# Patient Record
Sex: Female | Born: 1980 | Race: White | Hispanic: No | Marital: Married | Smoking: Never smoker
Health system: Southern US, Community
[De-identification: ages and names within clinical notes are randomized; demographics above are authoritative.]

---

## 2003-08-01 ENCOUNTER — Other Ambulatory Visit: Admission: RE | Admit: 2003-08-01 | Discharge: 2003-08-01 | Payer: Self-pay | Admitting: Obstetrics and Gynecology

## 2018-01-20 ENCOUNTER — Other Ambulatory Visit: Payer: Self-pay

## 2018-01-20 ENCOUNTER — Ambulatory Visit (HOSPITAL_COMMUNITY)
Admission: EM | Admit: 2018-01-20 | Discharge: 2018-01-20 | Disposition: A | Discharge status: Home / Self Care | Payer: BLUE CROSS/BLUE SHIELD | Attending: Family Medicine | Admitting: Family Medicine

## 2018-01-20 ENCOUNTER — Encounter (HOSPITAL_COMMUNITY): Payer: Self-pay | Admitting: *Deleted

## 2018-01-20 DIAGNOSIS — J039 Acute tonsillitis, unspecified: Secondary | ICD-10-CM | POA: Diagnosis not present

## 2018-01-20 DIAGNOSIS — R509 Fever, unspecified: Secondary | ICD-10-CM

## 2018-01-20 MED ORDER — AMOXICILLIN-POT CLAVULANATE 875-125 MG PO TABS: 1.0000 | ORAL_TABLET | Freq: Two times a day (BID) | ORAL | 0 refills | Status: AC

## 2018-01-20 MED ORDER — DEXAMETHASONE SODIUM PHOSPHATE 10 MG/ML IJ SOLN
INTRAMUSCULAR | Status: AC
  Filled 2018-01-20: qty 1

## 2018-01-20 MED ORDER — ONDANSETRON 4 MG PO TBDP
ORAL_TABLET | ORAL | Status: AC
  Filled 2018-01-20: qty 1

## 2018-01-20 MED ORDER — ONDANSETRON 4 MG PO TBDP
4.0000 mg | ORAL_TABLET | Freq: Once | ORAL | Status: AC
  Administered 2018-01-20: 4 mg via ORAL

## 2018-01-20 MED ORDER — ACETAMINOPHEN 325 MG PO TABS
650.0000 mg | ORAL_TABLET | Freq: Once | ORAL | Status: AC
  Administered 2018-01-20: 650 mg via ORAL

## 2018-01-20 MED ORDER — ACETAMINOPHEN 325 MG PO TABS
325.0000 mg | ORAL_TABLET | Freq: Once | ORAL | Status: AC
  Administered 2018-01-20: 325 mg via ORAL

## 2018-01-20 MED ORDER — ACETAMINOPHEN 325 MG PO TABS: 325.0000 mg | ORAL_TABLET | Freq: Once | ORAL | Status: DC

## 2018-01-20 MED ORDER — ACETAMINOPHEN 325 MG PO TABS
ORAL_TABLET | ORAL | Status: AC
  Filled 2018-01-20: qty 1

## 2018-01-20 MED ORDER — ONDANSETRON HCL 4 MG PO TABS: 4.0000 mg | ORAL_TABLET | Freq: Three times a day (TID) | ORAL | 0 refills | Status: AC | PRN

## 2018-01-20 MED ORDER — ACETAMINOPHEN 325 MG PO TABS
ORAL_TABLET | ORAL | Status: AC
  Filled 2018-01-20: qty 2

## 2018-01-20 MED ORDER — DEXAMETHASONE SODIUM PHOSPHATE 10 MG/ML IJ SOLN
10.0000 mg | Freq: Once | INTRAMUSCULAR | Status: AC
  Administered 2018-01-20: 10 mg

## 2018-01-20 NOTE — ED Provider Notes (Signed)
[facility]    CSN: [id number] Arrival date & time: 01/20/18  1811     History   Chief Complaint Chief Complaint  Patient presents with  . Fever    HPI Susan Bird is a 37 y.o. female.   [name] presents with complaints of worsening throat pain, swelling and fevers. Originally started 4 days ago, has had temps of 102. Today after napping temp of 104.5. She was seen yesterday by her PCP and was negative for strep, mono and flu. Was started on pen v. She has taken 4 doses. She took 1000mg  tylenol last at 1230 today. She is allergic to ibuprofen. She has taken a few doses of aspirin which have not helped. She has vomited twice, last was last night. Headache and right ear pain. Neck is sore. Denies previous similar. without cough or runny nose. Feels she has some post nasal drip. Nausea has improved now that she has received zofran in clinic. Without contributing medical history. Without rash. No known ill contacts.    ROS per HPI.       History reviewed. No pertinent past medical history.  There are no active problems to display for this patient.   Past Surgical History:  Procedure Laterality Date  . CESAREAN SECTION     x2    OB History    No data available       Home Medications    Prior to Admission medications   Medication Sig Start Date End Date Taking? Authorizing Provider  ACETAMINOPHEN PO Take by mouth.   Yes [provider]  penicillin v potassium (VEETID) 500 MG tablet Take 500 mg by mouth 3 (three) times daily.   Yes [provider]  amoxicillin-clavulanate (AUGMENTIN) 875-125 MG tablet Take 1 tablet by mouth every 12 (twelve) hours for 10 days. 01/20/18 01/30/18  Georgetta Haber, NP  ondansetron (ZOFRAN) 4 MG tablet Take 1 tablet (4 mg total) by mouth every 8 (eight) hours as needed for nausea or vomiting. 01/20/18   Georgetta Haber, NP    Family History No family history on file.  Social History Social History   Tobacco Use   . Smoking status: Never Smoker  Substance Use Topics  . Alcohol use: Yes    Comment: occasionally  . Drug use: No     Allergies   Cephalosporins and Ibuprofen   Review of Systems Review of Systems   Physical Exam Triage Vital Signs ED Triage Vitals  Enc Vitals Group     BP 01/20/18 1918 116/68     Pulse Rate 01/20/18 1918 (!) 124     Resp 01/20/18 1918 20     Temp 01/20/18 1918 (!) 101.1 F (38.4 C)     Temp Source 01/20/18 1918 Oral     SpO2 01/20/18 1918 100 %     Weight --      Height --      Head Circumference --      Peak Flow --      Pain Score 01/20/18 1919 9     Pain Loc --      Pain Edu? --      Excl. in GC? --    No data found.  Updated Vital Signs BP 116/68   Pulse (!) 124   Temp (!) 101.1 F (38.4 C) (Oral)   Resp 20   LMP 01/12/2018 (Exact Date)   SpO2 100%   Visual Acuity Right Eye Distance:   Left Eye Distance:  Bilateral Distance:    Right Eye Near:   Left Eye Near:    Bilateral Near:     Physical Exam  Constitutional: She is oriented to person, place, and time. She appears well-developed and well-nourished. She appears ill. No distress.  HENT:  Head: Normocephalic and atraumatic.  Right Ear: External ear and ear canal normal. Tympanic membrane is erythematous.  Left Ear: Tympanic membrane, external ear and ear canal normal.  Nose: Nose normal.  Mouth/Throat: Uvula is midline, oropharynx is clear and moist and mucous membranes are normal. Tonsils are 2+ on the right. Tonsils are 2+ on the left. Tonsillar exudate.  Uvula midline; right tonsil > left with significant exudate; swallowing without difficulty   Eyes: Conjunctivae and EOM are normal. Pupils are equal, round, and reactive to light.  Cardiovascular: Regular rhythm and normal heart sounds. Tachycardia present.  Pulmonary/Chest: Effort normal and breath sounds normal.  Lymphadenopathy:    She has cervical adenopathy.  Neurological: She is alert and oriented to person,  place, and time.  Skin: Skin is warm and dry.     UC Treatments / Results  Labs (all labs ordered are listed, but only abnormal results are displayed) Labs Reviewed - No data to display  EKG  EKG Interpretation None       Radiology No results found.  Procedures Procedures (including critical care time)  Medications Ordered in UC Medications  dexamethasone (DECADRON) injection 10 mg (not administered)  acetaminophen (TYLENOL) tablet 650 mg (650 mg Oral Given 01/20/18 1928)  ondansetron (ZOFRAN-ODT) disintegrating tablet 4 mg (4 mg Oral Given 01/20/18 1927)     Initial Impression / Assessment and Plan / UC Course  I have reviewed the triage vital signs and the nursing notes.  Pertinent labs & imaging results that were available during my care of the patient were reviewed by me and considered in my medical decision making (see chart for details).     Per chart review notes consistent with patient history with negative strep, flu and mono. Will try changing to augmentin. Decadron provided. Tylenol given in clinic. Return precautions provided. Patient verbalized understanding and agreeable to plan.    Final Clinical Impressions(s) / UC Diagnoses   Final diagnoses:  Tonsillitis  Fever, unspecified    ED Discharge Orders        Ordered    amoxicillin-clavulanate (AUGMENTIN) 875-125 MG tablet  Every 12 hours     01/20/18 2003    ondansetron (ZOFRAN) 4 MG tablet  Every 8 hours PRN     01/20/18 2003       Controlled Substance Prescriptions [place] Controlled Substance Registry consulted? Not Applicable   Georgetta HaberBurky, Sten Dematteo B, NP 01/20/18 2013

## 2018-01-20 NOTE — ED Triage Notes (Signed)
Was put on PCN for clinical appearance of strep yesterday, despite neg strep.  Today feeling much worse; temp up to 104.5 this afternoon.  Has been taking tyl - last dose @ 1230.  C/O nausea, one episode vomiting, neck pain, sore throat.  Feels throat is becoming more swollen.

## 2018-01-20 NOTE — Discharge Instructions (Signed)
Push fluids to ensure adequate hydration and keep secretions thin.  Tylenol as needed for pain or fevers.  Physically cooling your body can help with fever reduction as well. Will change your antibiotic. May start tonight if you haven't already taken your night dose of penicillin. If develop worsening of pain, difficulty swallowing, visible swelling to neck or otherwise worsening please go to the Er.

## 2020-01-18 ENCOUNTER — Ambulatory Visit: Payer: Medicaid Other | Attending: Internal Medicine

## 2020-01-18 DIAGNOSIS — Z23 Encounter for immunization: Secondary | ICD-10-CM

## 2020-01-18 NOTE — Progress Notes (Signed)
   Covid-19 Vaccination Clinic  Name:  ANNELI BING    MRN: [id number] DOB: Apr 07, 1981  01/18/2020  Ms. Lisa was observed post Covid-19 immunization for 15 minutes without incident. She was provided with Vaccine Information Sheet and instruction to access the V-Safe system.   Ms. Sussman was instructed to call 911 with any severe reactions post vaccine: Marland Kitchen Difficulty breathing  . Swelling of face and throat  . A fast heartbeat  . A bad rash all over body  . Dizziness and weakness   Immunizations Administered    Name Date Dose VIS Date Route   Pfizer COVID-19 Vaccine 01/18/2020  8:57 AM 0.3 mL 10/25/2019 Intramuscular   Manufacturer: [employer], [employer]   Lot: [id number]   NDC: [address]-4

## 2020-02-08 ENCOUNTER — Ambulatory Visit: Payer: Medicaid Other | Attending: Internal Medicine

## 2020-02-08 DIAGNOSIS — Z23 Encounter for immunization: Secondary | ICD-10-CM

## 2020-02-08 NOTE — Progress Notes (Signed)
   Covid-19 Vaccination Clinic  Name:  GLENDELL SCHLOTTMAN    MRN: [id number] DOB: [birth year]  02/08/2020  Ms. Balla was observed post Covid-19 immunization for 15 minutes without incident. She was provided with Vaccine Information Sheet and instruction to access the V-Safe system.   Ms. Allport was instructed to call 911 with any severe reactions post vaccine: Marland Kitchen Difficulty breathing  . Swelling of face and throat  . A fast heartbeat  . A bad rash all over body  . Dizziness and weakness   Immunizations Administered    Name Date Dose VIS Date Route   Pfizer COVID-19 Vaccine 02/08/2020  8:34 AM 0.3 mL 10/25/2019 Intramuscular   Manufacturer: [employer], [employer]   Lot: [id number]   NDC: [address]-2

## 2020-10-07 ENCOUNTER — Emergency Department (HOSPITAL_COMMUNITY)
Admission: EM | Admit: 2020-10-07 | Discharge: 2020-10-07 | Disposition: A | Discharge status: Home / Self Care | Payer: PRIVATE HEALTH INSURANCE | Attending: Emergency Medicine | Admitting: Emergency Medicine

## 2020-10-07 ENCOUNTER — Emergency Department (HOSPITAL_COMMUNITY): Payer: PRIVATE HEALTH INSURANCE

## 2020-10-07 ENCOUNTER — Other Ambulatory Visit: Payer: Self-pay

## 2020-10-07 ENCOUNTER — Encounter (HOSPITAL_COMMUNITY): Payer: Self-pay | Admitting: Emergency Medicine

## 2020-10-07 DIAGNOSIS — X58XXXA Exposure to other specified factors, initial encounter: Secondary | ICD-10-CM | POA: Insufficient documentation

## 2020-10-07 DIAGNOSIS — M62830 Muscle spasm of back: Secondary | ICD-10-CM

## 2020-10-07 DIAGNOSIS — S39012A Strain of muscle, fascia and tendon of lower back, initial encounter: Secondary | ICD-10-CM | POA: Diagnosis not present

## 2020-10-07 DIAGNOSIS — M545 Low back pain, unspecified: Secondary | ICD-10-CM

## 2020-10-07 DIAGNOSIS — S3992XA Unspecified injury of lower back, initial encounter: Secondary | ICD-10-CM | POA: Diagnosis present

## 2020-10-07 LAB — CBC WITH DIFFERENTIAL/PLATELET
Abs Immature Granulocytes: 0.02 K/uL (ref 0.00–0.07)
Basophils Absolute: 0.1 K/uL (ref 0.0–0.1)
Basophils Relative: 1 %
Eosinophils Absolute: 0.2 K/uL (ref 0.0–0.5)
Eosinophils Relative: 3 %
HCT: 42.6 % (ref 36.0–46.0)
Hemoglobin: 14.4 g/dL (ref 12.0–15.0)
Immature Granulocytes: 0 %
Lymphocytes Relative: 16 %
Lymphs Abs: 1.4 K/uL (ref 0.7–4.0)
MCH: 29.7 pg (ref 26.0–34.0)
MCHC: 33.8 g/dL (ref 30.0–36.0)
MCV: 87.8 fL (ref 80.0–100.0)
Monocytes Absolute: 0.4 K/uL (ref 0.1–1.0)
Monocytes Relative: 4 %
Neutro Abs: 6.6 K/uL (ref 1.7–7.7)
Neutrophils Relative %: 76 %
Platelets: 282 K/uL (ref 150–400)
RBC: 4.85 MIL/uL (ref 3.87–5.11)
RDW: 12.9 % (ref 11.5–15.5)
WBC: 8.6 K/uL (ref 4.0–10.5)
nRBC: 0 % (ref 0.0–0.2)

## 2020-10-07 LAB — URINALYSIS, ROUTINE W REFLEX MICROSCOPIC
Bilirubin Urine: NEGATIVE
Glucose, UA: NEGATIVE mg/dL
Hgb urine dipstick: NEGATIVE
Ketones, ur: NEGATIVE mg/dL
Leukocytes,Ua: NEGATIVE
Nitrite: NEGATIVE
Protein, ur: NEGATIVE mg/dL
Specific Gravity, Urine: 1.025 (ref 1.005–1.030)
pH: 7 (ref 5.0–8.0)

## 2020-10-07 LAB — COMPREHENSIVE METABOLIC PANEL
ALT: 13 U/L (ref 0–44)
AST: 16 U/L (ref 15–41)
Albumin: 3.6 g/dL (ref 3.5–5.0)
Alkaline Phosphatase: 40 U/L (ref 38–126)
Anion gap: 7 (ref 5–15)
BUN: 9 mg/dL (ref 6–20)
CO2: 22 mmol/L (ref 22–32)
Calcium: 8.4 mg/dL — ABNORMAL LOW (ref 8.9–10.3)
Chloride: 109 mmol/L (ref 98–111)
Creatinine, Ser: 0.63 mg/dL (ref 0.44–1.00)
GFR, Estimated: >60 mL/min (ref >60)
Glucose, Bld: 114 mg/dL — ABNORMAL HIGH (ref 70–99)
Potassium: 4 mmol/L (ref 3.5–5.1)
Sodium: 138 mmol/L (ref 135–145)
Total Bilirubin: 0.5 mg/dL (ref 0.3–1.2)
Total Protein: 6.9 g/dL (ref 6.5–8.1)

## 2020-10-07 LAB — I-STAT BETA HCG BLOOD, ED (MC, WL, AP ONLY): I-stat hCG, quantitative: <5 m[IU]/mL (ref <5)

## 2020-10-07 LAB — MAGNESIUM: Magnesium: 2 mg/dL (ref 1.7–2.4)

## 2020-10-07 MED ORDER — CYCLOBENZAPRINE HCL 10 MG PO TABS: 10.0000 mg | ORAL_TABLET | Freq: Two times a day (BID) | ORAL | 0 refills | Status: AC | PRN

## 2020-10-07 MED ORDER — CYCLOBENZAPRINE HCL 10 MG PO TABS
10.0000 mg | ORAL_TABLET | Freq: Once | ORAL | Status: AC
  Administered 2020-10-07: 10 mg via ORAL
  Filled 2020-10-07: qty 1

## 2020-10-07 MED ORDER — LIDOCAINE 5 % EX PTCH
1.0000 | MEDICATED_PATCH | CUTANEOUS | Status: DC
  Administered 2020-10-07: 1 via TRANSDERMAL
  Filled 2020-10-07: qty 1

## 2020-10-07 MED ORDER — OXYCODONE-ACETAMINOPHEN 5-325 MG PO TABS
1.0000 | ORAL_TABLET | Freq: Once | ORAL | Status: AC
Start: 2020-10-07 — End: 2020-10-07
  Administered 2020-10-07: 1 via ORAL
  Filled 2020-10-07: qty 1

## 2020-10-07 MED ORDER — MORPHINE SULFATE (PF) 4 MG/ML IV SOLN
4.0000 mg | Freq: Once | INTRAVENOUS | Status: AC
  Administered 2020-10-07: 4 mg via INTRAVENOUS
  Filled 2020-10-07: qty 1

## 2020-10-07 MED ORDER — LIDOCAINE 5 % EX PTCH: 1.0000 | MEDICATED_PATCH | CUTANEOUS | 0 refills | Status: AC

## 2020-10-07 MED ORDER — OXYCODONE-ACETAMINOPHEN 5-325 MG PO TABS: 1.0000 | ORAL_TABLET | ORAL | 0 refills | Status: AC | PRN

## 2020-10-07 NOTE — Discharge Instructions (Signed)
Your images today did not show evidence of acute fracture dislocation.  Your history and exam are consistent with muscle spasms causing your discomfort.  Please use the patches, pain medicine, muscle relaxant help with the discomfort and please try to rest.  Please follow-up with your primary doctor and if symptoms do not improve, consider following up with outpatient neurosurgery.  If any symptoms change or worsen, please return to the nearest emergency department.

## 2020-10-07 NOTE — ED Notes (Signed)
Pt emptied bladder and purewick was removed per pt request.

## 2020-10-07 NOTE — ED Notes (Signed)
Purewick placed on pt. 

## 2020-10-07 NOTE — ED Provider Notes (Signed)
[facility]EMERGENCY [facility]PT Provider Note   CSN: [id number] Arrival date & time: 10/07/20  [id number]     History Chief Complaint  Patient presents with  . Back Pain    Susan Bird is a 39 y.o. female.  The history is provided by the patient and medical records. No language interpreter was used.  Back Pain Location:  Lumbar spine Quality:  Aching, cramping and stabbing Radiates to:  Does not radiate Pain severity:  Severe Pain is:  Unable to specify Onset quality:  Gradual Duration:  1 day Timing:  Constant Progression:  Unchanged Chronicity:  New Context: not falling, not jumping from heights, not lifting heavy objects, not MVA, not occupational injury, not physical stress, not recent illness, not recent injury and not twisting   Relieved by:  Nothing Worsened by:  Sitting, twisting, touching, standing, movement and bending Ineffective treatments:  OTC medications Associated symptoms: no abdominal pain, no abdominal swelling, no bladder incontinence, no bowel incontinence, no chest pain, no dysuria, no fever, no headaches, no leg pain, no numbness, no paresthesias, no pelvic pain, no perianal numbness, no tingling and no weakness        History reviewed. No pertinent past medical history.  There are no problems to display for this patient.   Past Surgical History:  Procedure Laterality Date  . CESAREAN SECTION     x2     OB History   No obstetric history on file.     History reviewed. No pertinent family history.  Social History   Tobacco Use  . Smoking status: Never Smoker  Substance Use Topics  . Alcohol use: Yes    Comment: occasionally  . Drug use: No    Home Medications Prior to Admission medications   Medication Sig Start Date End Date Taking? Authorizing Provider  ACETAMINOPHEN PO Take by mouth.    [provider]  ondansetron (ZOFRAN) 4 MG tablet Take 1 tablet (4 mg total) by mouth every 8 (eight) hours as needed  for nausea or vomiting. 01/20/18   Linus MakoBurky, Natalie B, NP  penicillin v potassium (VEETID) 500 MG tablet Take 500 mg by mouth 3 (three) times daily.    [provider]    Allergies    Cephalosporins and Ibuprofen  Review of Systems   Review of Systems  Constitutional: Negative for chills, diaphoresis, fatigue and fever.  HENT: Negative for congestion.   Eyes: Negative for visual disturbance.  Respiratory: Negative for cough, chest tightness and shortness of breath.   Cardiovascular: Negative for chest pain and palpitations.  Gastrointestinal: Negative for abdominal pain, bowel incontinence, constipation, diarrhea, nausea and vomiting.  Genitourinary: Negative for bladder incontinence, dysuria, flank pain, frequency and pelvic pain.  Musculoskeletal: Positive for back pain. Negative for neck pain and neck stiffness.  Neurological: Negative for tingling, weakness, light-headedness, numbness, headaches and paresthesias.  Psychiatric/Behavioral: Negative for agitation and confusion.  All other systems reviewed and are negative.   Physical Exam Updated Vital Signs BP 120/75 (BP Location: Right Arm)   Pulse 75   Temp 98.5 F (36.9 C) (Oral)   Resp 13   Ht 5\' 3"  (1.6 m)   Wt 68 kg   SpO2 99%   BMI 26.57 kg/m   Physical Exam Vitals and nursing note reviewed.  Constitutional:      General: She is not in acute distress.    Appearance: She is well-developed. She is not ill-appearing, toxic-appearing or diaphoretic.  HENT:     Head: Normocephalic  and atraumatic.     Right Ear: External ear normal.     Left Ear: External ear normal.     Nose: Nose normal.     Mouth/Throat:     Mouth: Mucous membranes are moist.     Pharynx: No oropharyngeal exudate or posterior oropharyngeal erythema.  Eyes:     Extraocular Movements: Extraocular movements intact.     Conjunctiva/sclera: Conjunctivae normal.     Pupils: Pupils are equal, round, and reactive to light.  Cardiovascular:      Rate and Rhythm: Normal rate.     Pulses: Normal pulses.     Heart sounds: No murmur heard.   Pulmonary:     Effort: No respiratory distress.     Breath sounds: No stridor. No wheezing, rhonchi or rales.  Chest:     Chest wall: No tenderness.  Abdominal:     General: Abdomen is flat. There is no distension.     Tenderness: There is no abdominal tenderness. There is no right CVA tenderness, left CVA tenderness, guarding or rebound.  Musculoskeletal:        General: Tenderness present. No signs of injury.     Cervical back: Normal range of motion and neck supple. No signs of trauma or tenderness.     Thoracic back: No signs of trauma or tenderness.     Lumbar back: Spasms and tenderness present. No deformity or bony tenderness. Negative right straight leg raise test and negative left straight leg raise test.       Back:     Right lower leg: No edema.     Left lower leg: No edema.     Comments: Intact, strength, sensation, and pulses in the legs.  No anesthesia in her lower back or buttock area.  Skin:    General: Skin is warm.     Capillary Refill: Capillary refill takes less than 2 seconds.     Coloration: Skin is not pale.     Findings: No erythema or rash.  Neurological:     General: No focal deficit present.     Mental Status: She is alert and oriented to person, place, and time.     Sensory: No sensory deficit.     Motor: No weakness or abnormal muscle tone.     Deep Tendon Reflexes: Reflexes are normal and symmetric.  Psychiatric:        Mood and Affect: Mood normal.     ED Results / Procedures / Treatments   Labs (all labs ordered are listed, but only abnormal results are displayed) Labs Reviewed  COMPREHENSIVE METABOLIC PANEL - Abnormal; Notable for the following components:      Result Value   Glucose, Bld 114 (*)    Calcium 8.4 (*)    All other components within normal limits  URINALYSIS, ROUTINE W REFLEX MICROSCOPIC  CBC WITH DIFFERENTIAL/PLATELET  MAGNESIUM    I-STAT BETA HCG BLOOD, ED ([facility], [facility], [facility] ONLY)    EKG None  Radiology DG Lumbar Spine Complete  Result Date: 10/07/2020 CLINICAL DATA:  Low back pain. Additional history provided: Patient reports mid low back pain which began last night, worsening throughout the night, no known injury. EXAM: LUMBAR SPINE - COMPLETE 4+ VIEW COMPARISON:  No pertinent prior exams available for comparison. FINDINGS: Five lumbar vertebrae. No significant spondylolisthesis. Vertebral body height is maintained. Mild L5-S1 disc space narrowing. No more than mild disc space narrowing at the remaining levels. Two tubal ligation clips are somewhat high in position,  projecting in the region of the lower abdomen. IMPRESSION: No lumbar compression deformity. Mild lumbar spondylosis greatest at L5-S1. Two tubal ligation clips are somewhat high in position, projecting in the region of the lower abdomen. This is of uncertain clinical significance. Correlate with the procedural history. Electronically Signed   By: Jackey Loge DO   On: 10/07/2020 10:59    Procedures Procedures (including critical care time)  Medications Ordered in ED Medications  lidocaine (LIDODERM) 5 % 1 patch (1 patch Transdermal Patch Applied 10/07/20 1014)  morphine 4 MG/ML injection 4 mg (has no administration in time range)  cyclobenzaprine (FLEXERIL) tablet 10 mg (10 mg Oral Given 10/07/20 1013)  oxyCODONE-acetaminophen (PERCOCET/ROXICET) 5-325 MG per tablet 1 tablet (1 tablet Oral Given 10/07/20 1013)  morphine 4 MG/ML injection 4 mg (4 mg Intravenous Given 10/07/20 1316)    ED Course  I have reviewed the triage vital signs and the nursing notes.  Pertinent labs & imaging results that were available during my care of the patient were reviewed by me and considered in my medical decision making (see chart for details).    MDM Rules/Calculators/A&P                          Keenan C Klayton Monie is a 39 y.o. female with no significant past medical history  who presents with severe back pain.  She reports that the back pain yesterday and she thought it was a backache across her low back.  She reports take some Tylenol.  She reports that throughout the night it worsened and was up to 9 or 10 in severity this morning.  She reports that she had no preceding injury and no preceding trauma.  She denies lifting anything heavy.  She did report that while bending down trying to pull her pajamas up, it acutely worsened.  She reports it does not run down her legs and she denies any loss of bowel or bladder control, numbness, tingling, or weakness of legs.  She reports it is not go up into her upper back.  She reports does not go to the abdomen.  She reports it is an aching and sharp.  She says that when she is in a neutral position and not moving it improves.  She has never had back pain like this before.  She does report an extremely strong family history of kidney stones with both her brother and father having numerous stones.  She denies nausea, vomiting, fevers, chills, IV drug use.  She denies any chest pain or shortness of breath.  She reports no dysuria, hematuria, or urine changes.  She was in such pain, EMS was called as she could not walk to the pain.  She was given fentanyl in route which took the edge off.  She reports this still severe she tries to move.  On exam, patient had tenderness in her bilateral paraspinal lumbar area.  There was some palpated muscle tightness and spasm.  Her midline back was nontender.  She had no rashes.  Lungs were clear.  Abdomen was nontender and pelvis was nontender.  Straight leg raise negative on both sides with no sciatic discomfort.  Normal pulses, strength, sensation in legs.  No CVA tenderness.  Clinical I suspect this is severe lumbar pain with muscle spasms and muscle strain.  We discussed that with her extremely strong family history of kidney stones, stone is still considered and patient is amenable to trying laboratory  work-up with some labs to look for hematuria in the urine or infection which could cause bilateral pain with [place].  We will also get some screening labs to look for electrolyte imbalance leading to muscle spasms.  We agreed that we discussed possible imaging if needed based on the lab results.  If patient is hematuria, will consider a stone study to get a better look at her back as well as make sure she does not have any stone.  Patient did not want pain medicine initially as she is resting but when she tries to get up and move, she will likely need some medications.  Anticipate reassessment for work-up.  Laboratory testing showed no evidence of hematuria, doubt UTI or kidney stone as cause of pain.  She is not pregnant.  Other labs reassuring.  X-ray showed no acute fracture or dislocation.  Based on exam, doubt cauda equina or other acute cord compression or nerve injury.  She has no radicular symptoms, weakness, numbness, or bladder/bowel control problems.  After getting some morphine, her pain improved and she was able to sit up around an hour.  She was given one more dose of pain medicine prior to discharge home and was given Lidoderm, Flexeril, and oral pain medicine.  She will get a prescription for these and will follow up with outpatient PCP and was given contact information for neurosurgery if it persist.  I suspect muscle spasms and low back strain.  Patient will be discharged home and understands return precautions for any new or worsened symptoms.   Final Clinical Impression(s) / ED Diagnoses Final diagnoses:  Acute bilateral low back pain without sciatica  Muscle spasm of back  Strain of lumbar region, initial encounter    Rx / DC Orders ED Discharge Orders         Ordered    oxyCODONE-acetaminophen (PERCOCET/ROXICET) 5-325 MG tablet  Every 4 hours PRN        10/07/20 1619    cyclobenzaprine (FLEXERIL) 10 MG tablet  2 times daily PRN        10/07/20 1619    lidocaine  (LIDODERM) 5 %  Every 24 hours        10/07/20 1619         Clinical Impression: 1. Acute bilateral low back pain without sciatica   2. Muscle spasm of back   3. Strain of lumbar region, initial encounter     Disposition: Discharge  Condition: Good  I have discussed the results, Dx and Tx plan with the pt(& family if present). He/she/they expressed understanding and agree(s) with the plan. Discharge instructions discussed at great length. Strict return precautions discussed and pt &/or family have verbalized understanding of the instructions. No further questions at time of discharge.    New Prescriptions   CYCLOBENZAPRINE (FLEXERIL) 10 MG TABLET    Take 1 tablet (10 mg total) by mouth 2 (two) times daily as needed for muscle spasms.   LIDOCAINE (LIDODERM) 5 %    Place 1 patch onto the skin daily. Remove & Discard patch within 12 hours or as directed by MD   OXYCODONE-ACETAMINOPHEN (PERCOCET/ROXICET) 5-325 MG TABLET    Take 1 tablet by mouth every 4 (four) hours as needed for severe pain.    Follow Up: Pa, [facility] [address] [place] [address] [phone]     [facility] [address] [id number] [place] [address] [phone]  Jaiceon Collister, Canary Brim, MD 10/07/20 [phone]

## 2020-10-07 NOTE — ED Triage Notes (Signed)
BIBA Per EMS:   Pt coming from home with complaints of back pain that began last night and has been worsening throughout the night  Denies injury  fentanyl  Vitals:  125/81 81HR 100% RA  169 CBG  IV: 18G L AC

## 2020-11-03 ENCOUNTER — Other Ambulatory Visit: Payer: PRIVATE HEALTH INSURANCE

## 2021-12-21 IMAGING — CR DG LUMBAR SPINE COMPLETE 4+V
5 series · 5 of 5 positions shown · non-contrast
Comparison: No pertinent prior exams available for comparison.

CLINICAL DATA: Low back pain. Additional history provided: Patient
reports mid low back pain which began last night, worsening
throughout the night, no known injury.

EXAM:
LUMBAR SPINE - COMPLETE 4+ VIEW

[t lumbar spine ap]
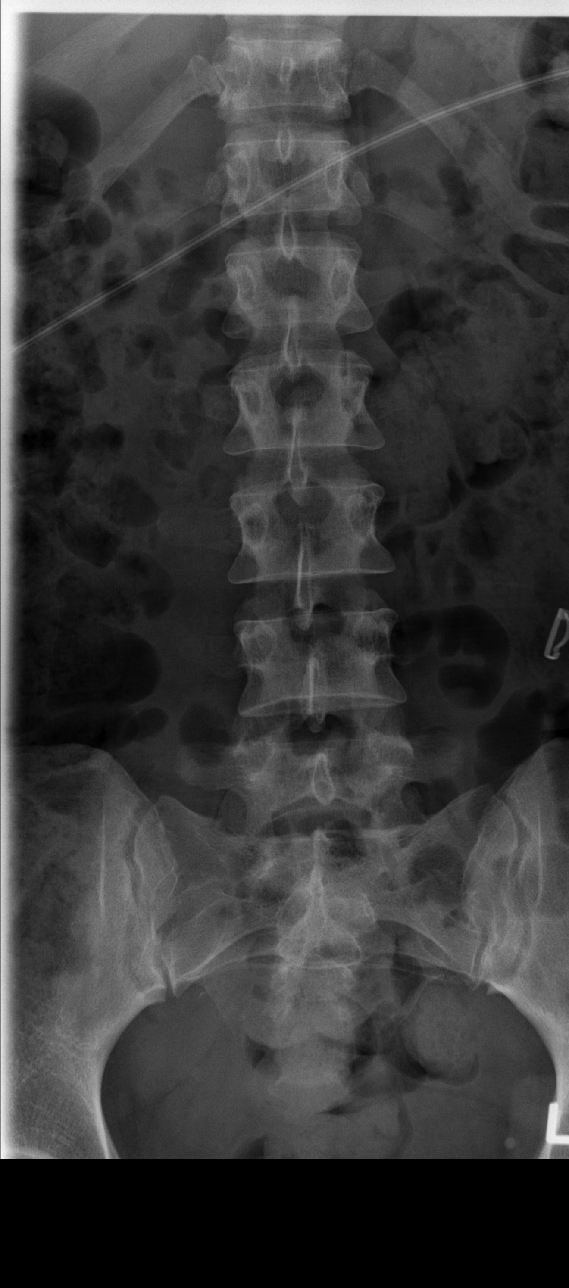

[t lumbar spine obl (1 of 2)]
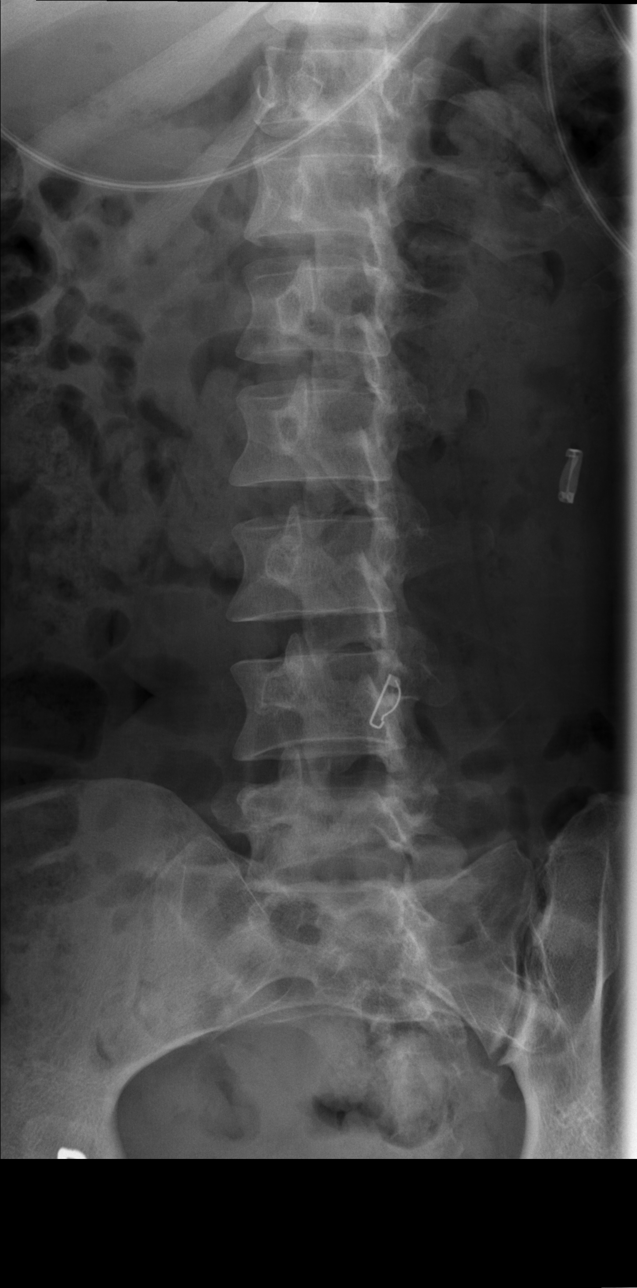

[t lumbar spine obl (2 of 2)]
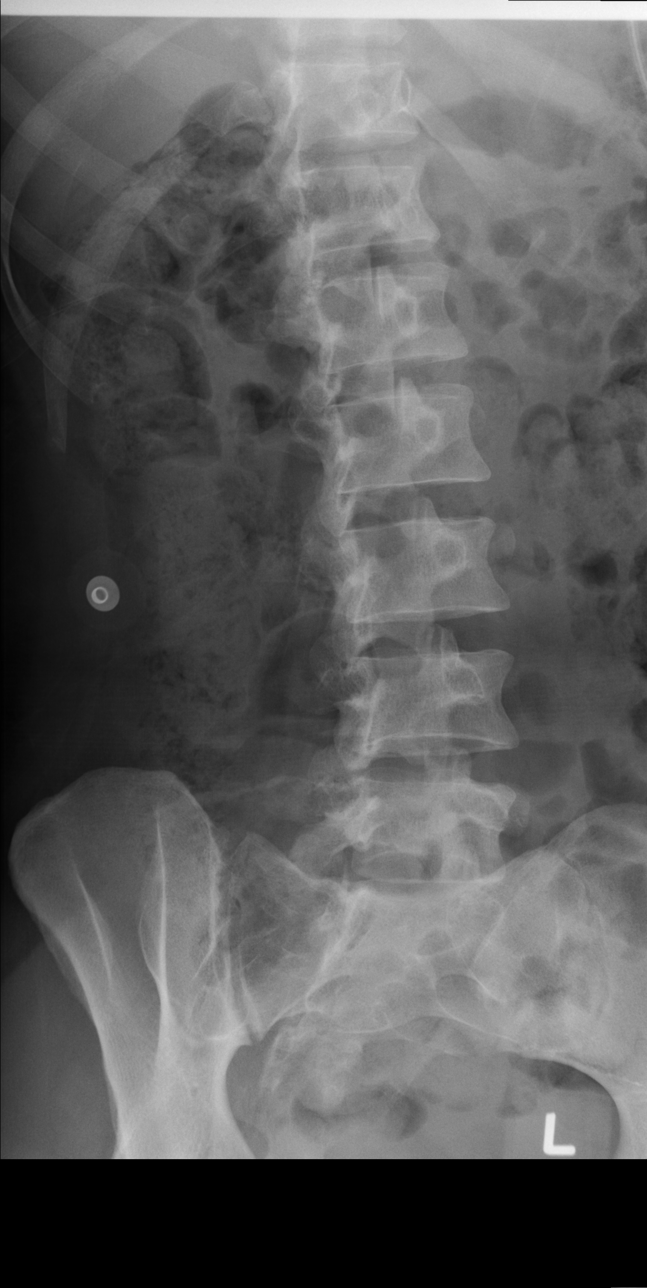

[t lumbar spine lat]
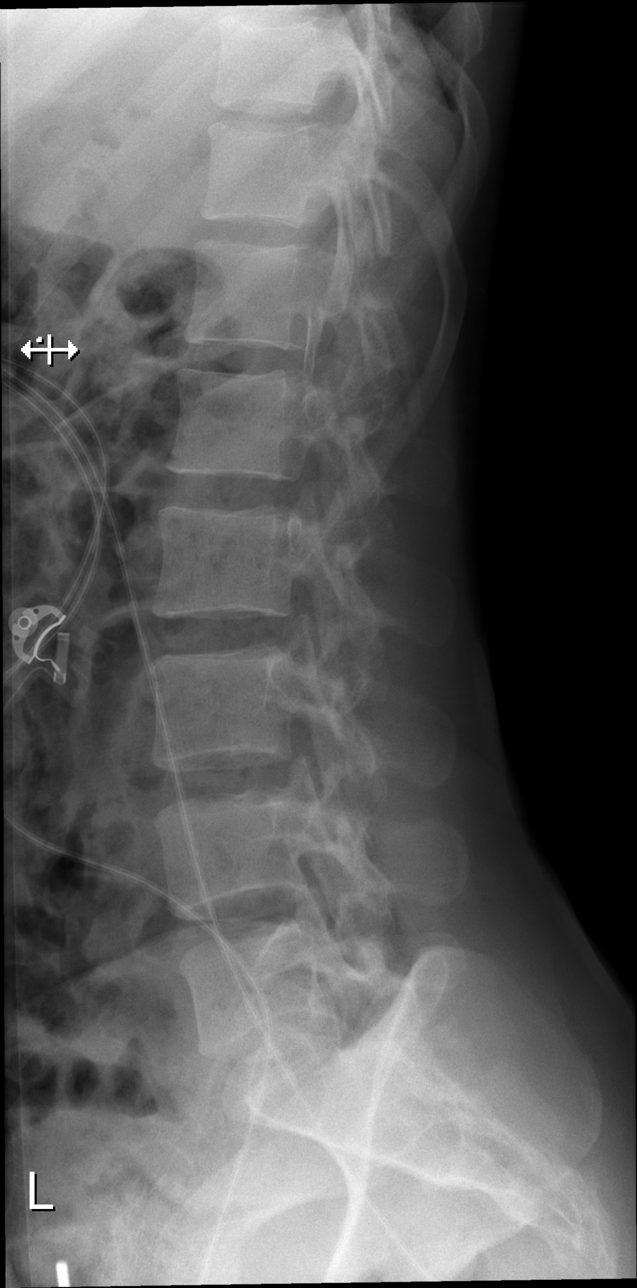

[t lumbar l-5 s-1 spot]
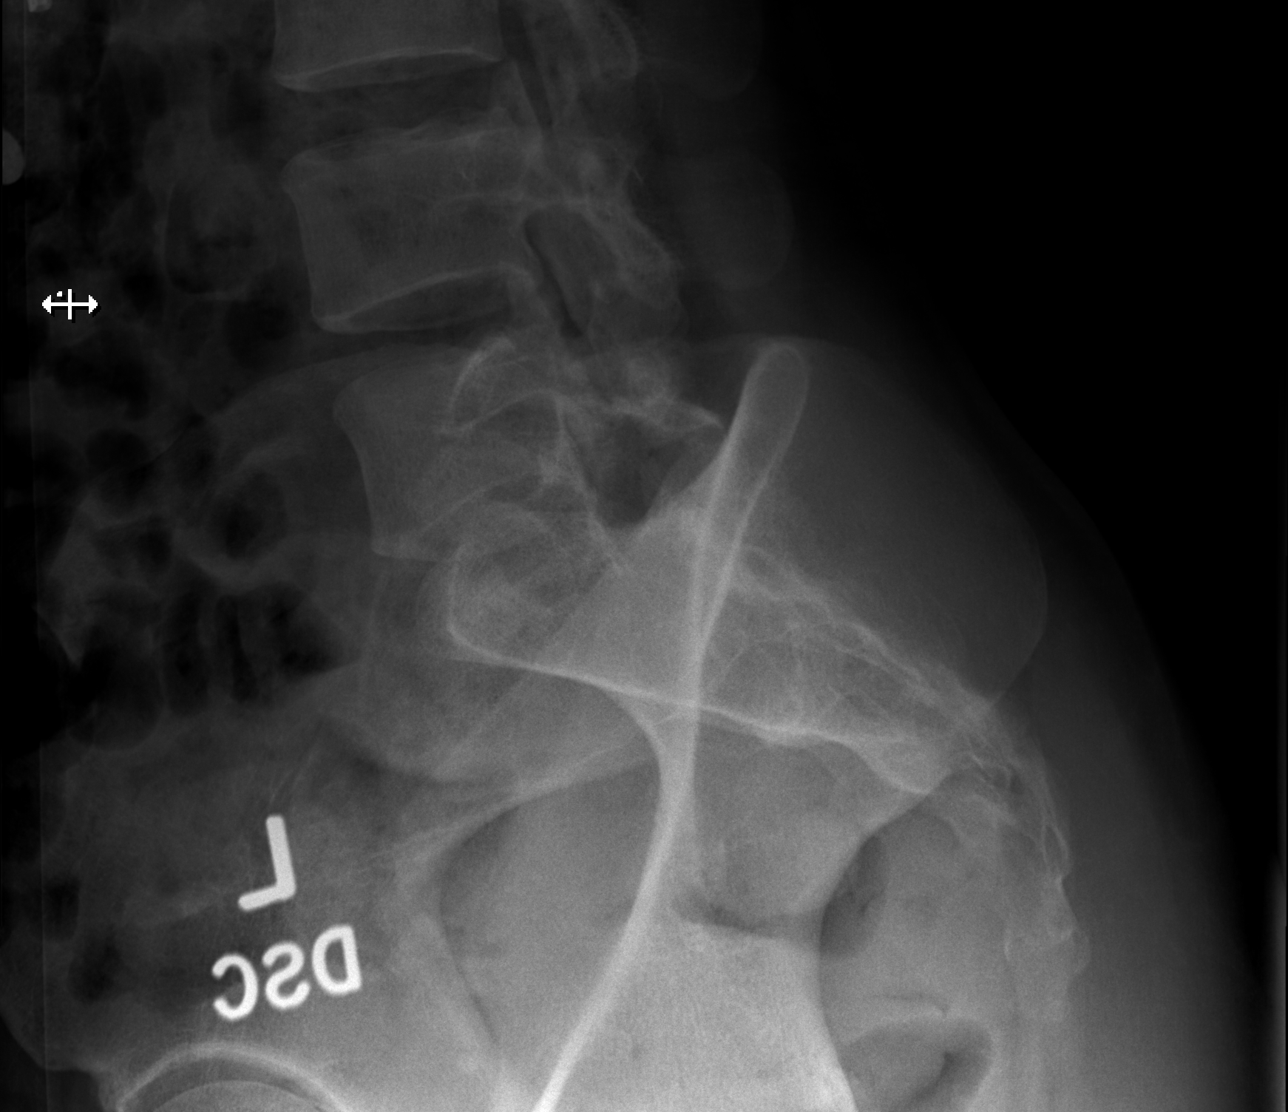

[5 of 5 positions shown; findings below may reference images not displayed]

FINDINGS: Five lumbar vertebrae.

No significant spondylolisthesis.

Vertebral body height is maintained.

Mild L5-S1 disc space narrowing. No more than mild disc space
narrowing at the remaining levels.

Two tubal ligation clips are somewhat high in position, projecting
in the region of the lower abdomen.
IMPRESSION: No lumbar compression deformity.

Mild lumbar spondylosis greatest at L5-S1.

Two tubal ligation clips are somewhat high in position, projecting
in the region of the lower abdomen. This is of uncertain clinical
significance. Correlate with the procedural history.
